# Patient Record
Sex: Male | Born: 1975
Health system: Southern US, Community
[De-identification: ages and names within clinical notes are randomized; demographics above are authoritative.]

## PROBLEM LIST (undated history)

## (undated) DIAGNOSIS — M109 Gout, unspecified: Secondary | ICD-10-CM

---

## 2004-04-20 ENCOUNTER — Emergency Department (HOSPITAL_COMMUNITY): Admission: EM | Admit: 2004-04-20 | Discharge: 2004-04-20 | Payer: Self-pay | Admitting: Emergency Medicine

## 2008-04-22 ENCOUNTER — Emergency Department: Payer: Self-pay | Admitting: Emergency Medicine

## 2008-12-14 ENCOUNTER — Emergency Department: Payer: Self-pay | Admitting: Emergency Medicine

## 2009-07-03 IMAGING — CR RIGHT ANKLE - COMPLETE 3+ VIEW
1 series · 5 of 5 positions shown · non-contrast
Comparison: none

REASON FOR EXAM: twisted his (R) ankle tonight on a curb
COMMENTS:

PROCEDURE:     DXR - DXR ANKLE RIGHT COMPLETE  - April 22, 2008  [DATE]
RESULT:     No fracture, dislocation or other acute bony abnormality is
identified.  The ankle mortise is well maintained.

[Series 1: view not recorded · 0.17mm/px · 5 of 5 slices shown]
[im 1/5]
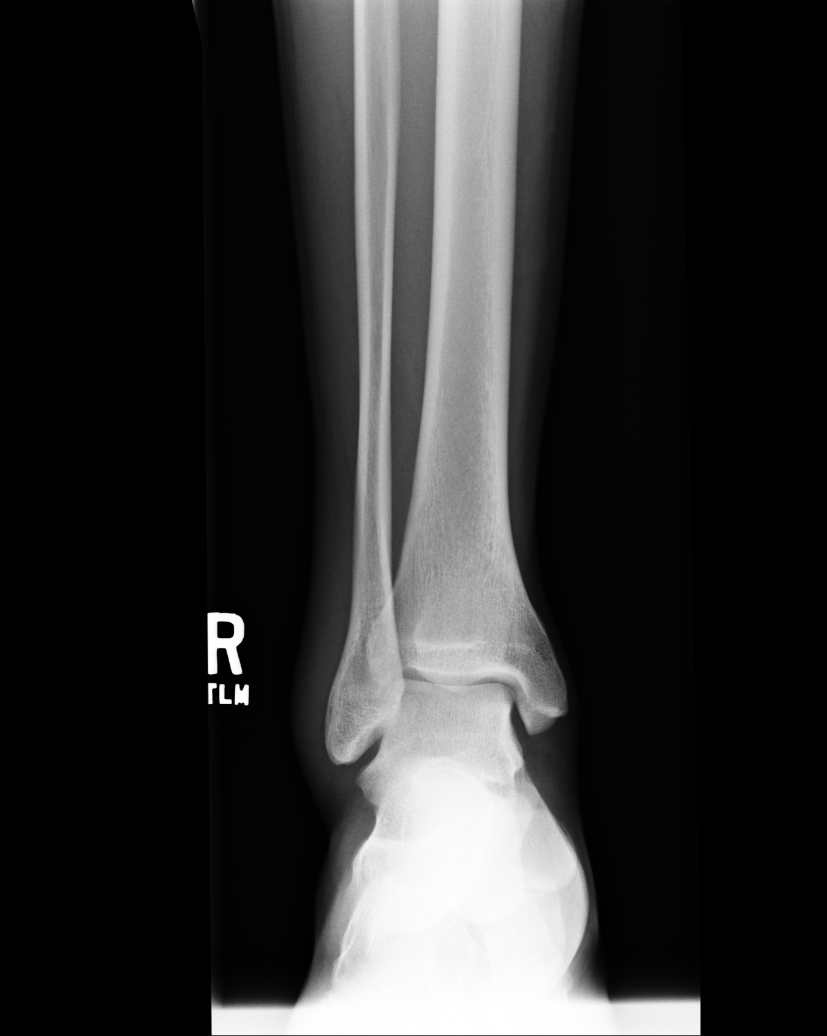
[im 2/5]
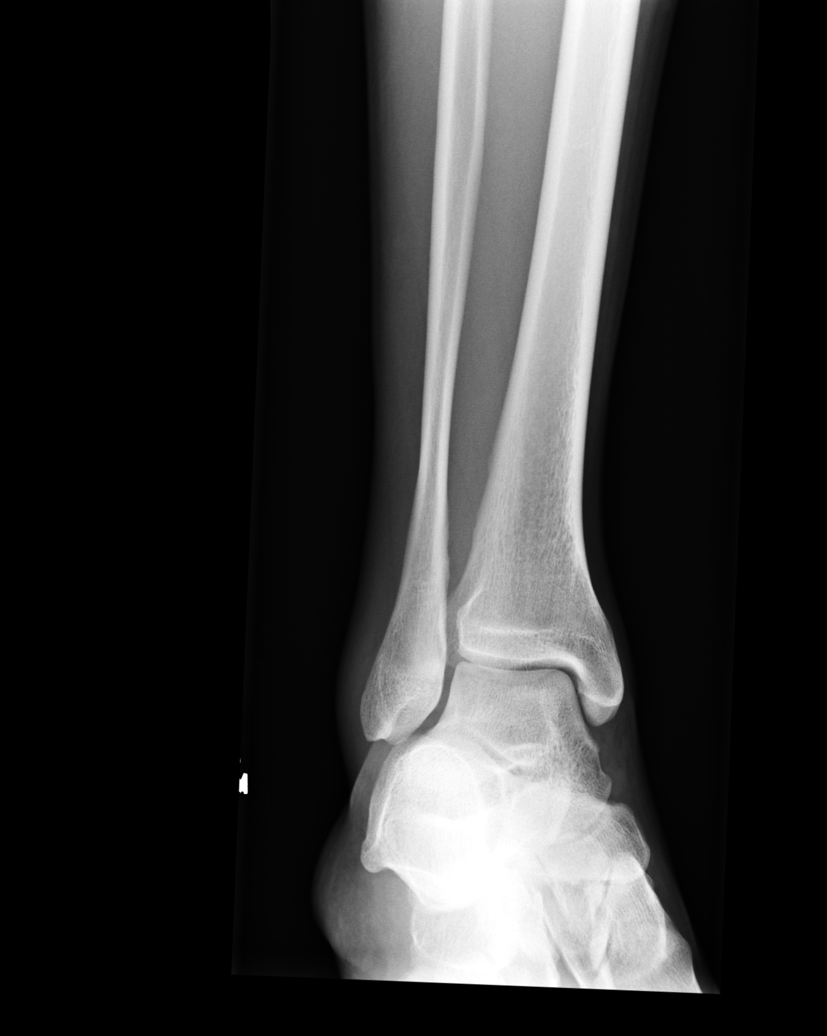
[im 3/5]
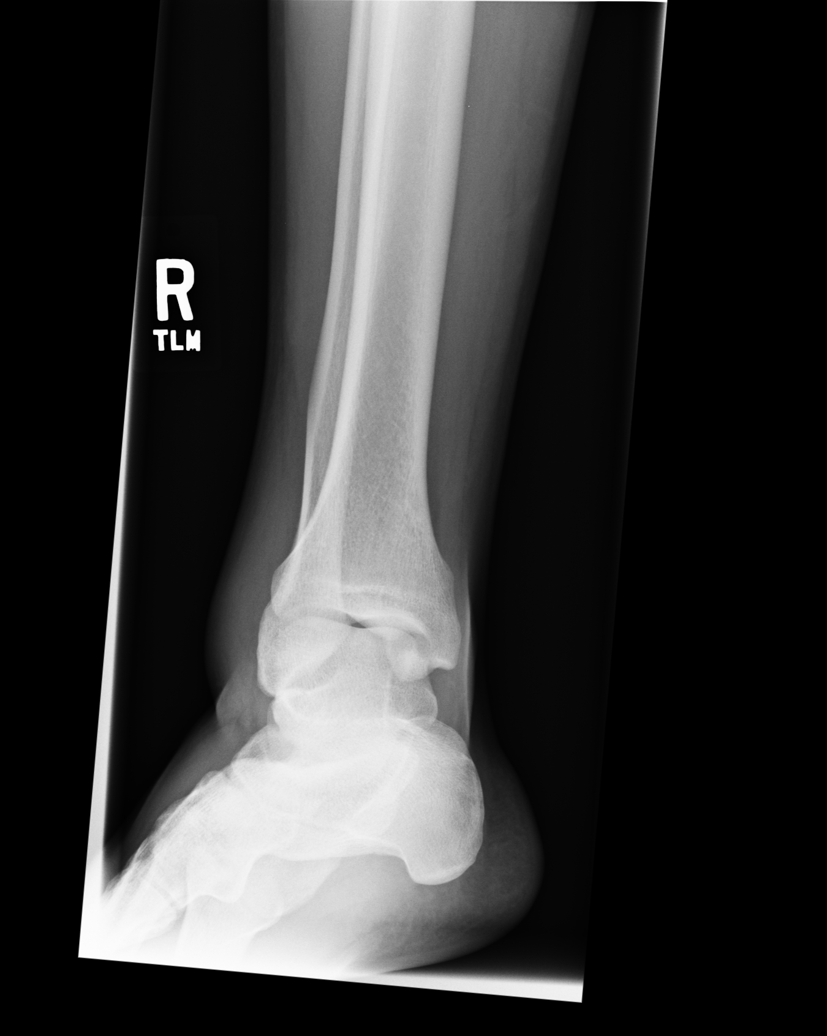
[im 4/5]
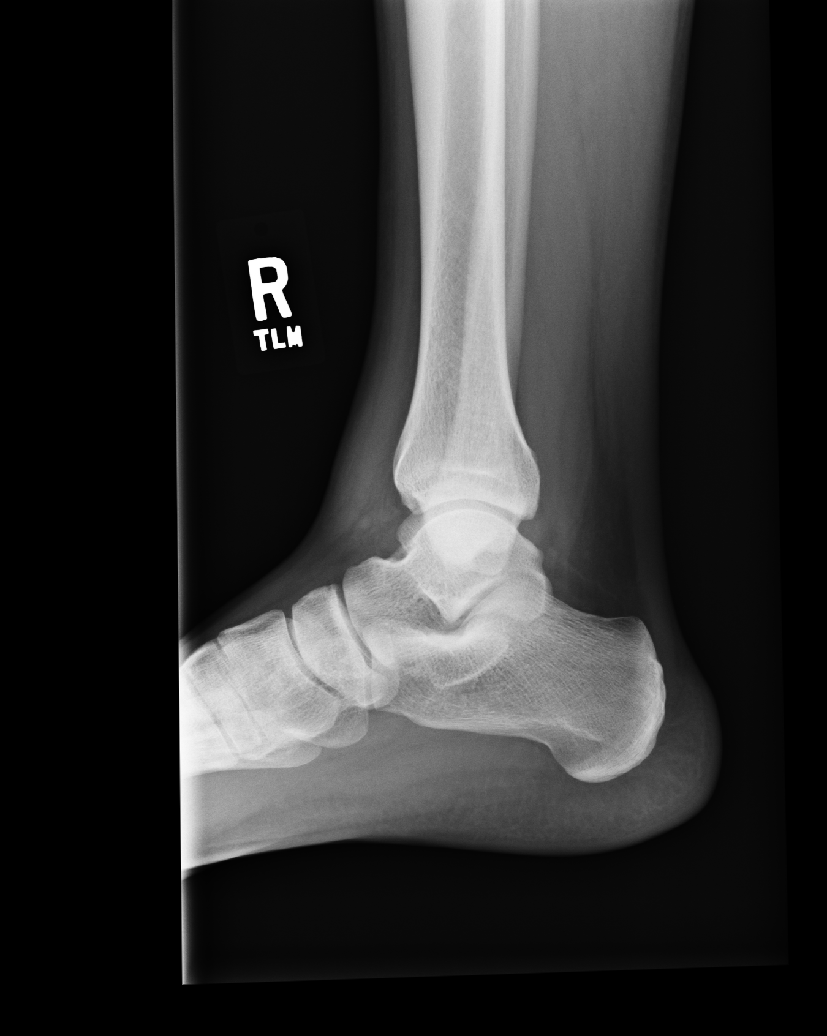
[im 5/5]
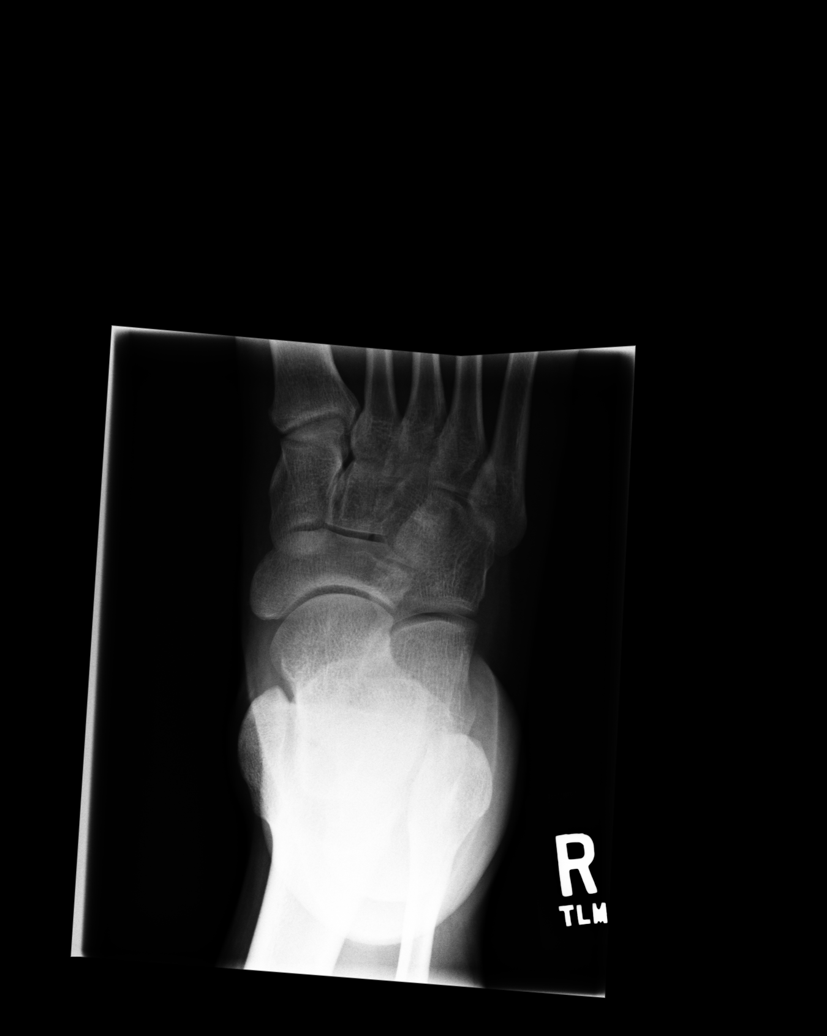

[5 of 5 positions shown; findings below may reference images not displayed]

IMPRESSION: No acute changes are identified.

## 2012-05-17 ENCOUNTER — Emergency Department: Payer: Self-pay | Admitting: Emergency Medicine

## 2017-12-18 ENCOUNTER — Emergency Department
Admission: EM | Admit: 2017-12-18 | Discharge: 2017-12-18 | Disposition: A | Discharge status: Home / Self Care | Payer: 59 | Attending: Emergency Medicine | Admitting: Emergency Medicine

## 2017-12-18 ENCOUNTER — Other Ambulatory Visit: Payer: Self-pay

## 2017-12-18 ENCOUNTER — Encounter: Payer: Self-pay | Admitting: Emergency Medicine

## 2017-12-18 DIAGNOSIS — R509 Fever, unspecified: Secondary | ICD-10-CM | POA: Diagnosis present

## 2017-12-18 DIAGNOSIS — J020 Streptococcal pharyngitis: Secondary | ICD-10-CM | POA: Insufficient documentation

## 2017-12-18 DIAGNOSIS — F17210 Nicotine dependence, cigarettes, uncomplicated: Secondary | ICD-10-CM | POA: Diagnosis not present

## 2017-12-18 HISTORY — DX: Gout, unspecified: M10.9

## 2017-12-18 LAB — INFLUENZA PANEL BY PCR (TYPE A & B)
Influenza A By PCR: NEGATIVE
Influenza B By PCR: NEGATIVE

## 2017-12-18 LAB — GROUP A STREP BY PCR: GROUP A STREP BY PCR: DETECTED — AB

## 2017-12-18 MED ORDER — AMOXICILLIN 875 MG PO TABS: 875.0000 mg | ORAL_TABLET | Freq: Two times a day (BID) | ORAL | 0 refills | Status: DC

## 2017-12-18 MED ORDER — DIPHENHYDRAMINE HCL 12.5 MG/5ML PO SYRP: 12.5000 mg | ORAL_SOLUTION | Freq: Four times a day (QID) | ORAL | 0 refills | Status: DC | PRN

## 2017-12-18 MED ORDER — KETOROLAC TROMETHAMINE 60 MG/2ML IM SOLN
60.0000 mg | Freq: Once | INTRAMUSCULAR | Status: AC
  Administered 2017-12-18: 60 mg via INTRAMUSCULAR
  Filled 2017-12-18: qty 2

## 2017-12-18 MED ORDER — NAPROXEN 500 MG PO TABS: 500.0000 mg | ORAL_TABLET | Freq: Two times a day (BID) | ORAL | Status: DC

## 2017-12-18 MED ORDER — DIPHENHYDRAMINE HCL 12.5 MG/5ML PO ELIX
25.0000 mg | ORAL_SOLUTION | Freq: Once | ORAL | Status: AC
  Administered 2017-12-18: 25 mg via ORAL
  Filled 2017-12-18: qty 10

## 2017-12-18 MED ORDER — LIDOCAINE VISCOUS 2 % MT SOLN
15.0000 mL | Freq: Once | OROMUCOSAL | Status: AC
  Administered 2017-12-18: 15 mL via OROMUCOSAL
  Filled 2017-12-18: qty 15

## 2017-12-18 MED ORDER — LIDOCAINE VISCOUS 2 % MT SOLN: 5.0000 mL | Freq: Four times a day (QID) | OROMUCOSAL | 0 refills | Status: DC | PRN

## 2017-12-18 NOTE — ED Notes (Signed)
See triage note  States he developed body aches,headache,and fever times 1 day  States fever was 101-102 at home  Has been taking ibu for pain and fever  Afebrile on arrival but still having body aches and sore throat  States throat pain is worse with swallowing

## 2017-12-18 NOTE — ED Triage Notes (Signed)
Pt in via POV with complaints of fever, sore throat, sore throat, body aches x one day.  Pt reports being dx with flu via virtual appointment and put on Tamiflu.  Pt concerned that this is something other than the flu.  NAD noted at this time.

## 2017-12-18 NOTE — ED Provider Notes (Signed)
[facility] Emergency Department Provider Note   ____________________________________________   First MD Initiated Contact with Patient 12/18/17 [phone]     (approximate)  I have reviewed the triage vital signs and the nursing notes.   HISTORY  Chief Complaint URI    HPI Danny Bray is a 42 y.o. male patient complained of fever, sore throat and body aches for 1 day.  Patient state he was diagnosed with the flu via visual appointment and started Tamiflu yesterday.  Patient state no improvement.  Patient described his pain is "sore throat/body aches".  Past Medical History:  Diagnosis Date  . Gout     There are no active problems to display for this patient.   History reviewed. No pertinent surgical history.  Prior to Admission medications   Medication Sig Start Date End Date Taking? Authorizing Provider  amoxicillin (AMOXIL) 875 MG tablet Take 1 tablet (875 mg total) by mouth 2 (two) times daily. 12/18/17   Joni Reining, PA-C  diphenhydrAMINE (BENYLIN) 12.5 MG/5ML syrup Take 5 mLs (12.5 mg total) by mouth 4 (four) times daily as needed for allergies. Mix with 5 mL of viscous lidocaine for swish and swallow. 12/18/17   Joni Reining, PA-C  lidocaine (XYLOCAINE) 2 % solution Use as directed 5 mLs in the mouth or throat every 6 (six) hours as needed for mouth pain. Mix with 5 mL of Benadryl elixir for swish and swallow. 12/18/17   Joni Reining, PA-C  naproxen (NAPROSYN) 500 MG tablet Take 1 tablet (500 mg total) by mouth 2 (two) times daily with a meal. 12/18/17   Joni Reining, PA-C    Allergies Patient has no known allergies.  No family history on file.  Social History Social History   Tobacco Use  . Smoking status: Current Every Day Smoker    Packs/day: 0.50    Types: Cigarettes  . Smokeless tobacco: Never Used  Substance Use Topics  . Alcohol use: Yes  . Drug use: Never    Review of Systems Constitutional: No  fever/chills.  Body aches. Eyes: No visual changes. ENT: Sore throat . cardiovascular: Denies chest pain. Respiratory: Denies shortness of breath. Gastrointestinal: No abdominal pain.  No nausea, no vomiting.  No diarrhea.  No constipation. Genitourinary: Negative for dysuria. Musculoskeletal: Negative for back pain. Skin: Negative for rash. Neurological: Negative for headaches, focal weakness or numbness.   ____________________________________________   PHYSICAL EXAM:  VITAL SIGNS: ED Triage Vitals  Enc Vitals Group     BP 12/18/17 0809 (!) 136/101     Pulse Rate 12/18/17 0809 (!) 104     Resp 12/18/17 0809 20     Temp 12/18/17 0809 98.6 F (37 C)     Temp Source 12/18/17 0809 Oral     SpO2 12/18/17 0809 99 %     Weight 12/18/17 0810 204 lb (92.5 kg)     Height 12/18/17 0810 6' (1.829 m)     Head Circumference --      Peak Flow --      Pain Score 12/18/17 0809 8     Pain Loc --      Pain Edu? --      Excl. in GC? --    Constitutional: Alert and oriented. Well appearing and in no acute distress. Nose: No congestion/rhinnorhea. Mouth/Throat: Mucous membranes are moist.  Oropharynx erythematous.  Edematous tonsils and uvula. Neck: No stridor. Hematological/Lymphatic/Immunilogical: Bilateral cervical lymphadenopathy Cardiovascular: Normal rate, regular rhythm. Grossly normal heart  sounds.  Good peripheral circulation. Respiratory: Normal respiratory effort.  No retractions. Lungs CTAB. Gastrointestinal: Soft and nontender. No distention. No abdominal bruits. No CVA tenderness. Musculoskeletal: No lower extremity tenderness nor edema.  No joint effusions. Neurologic:  Normal speech and language. No gross focal neurologic deficits are appreciated. No gait instability. Skin:  Skin is warm, dry and intact. No rash noted. Psychiatric: Mood and affect are normal. Speech and behavior are normal.  ____________________________________________   LABS (all labs ordered are  listed, but only abnormal results are displayed)  Labs Reviewed  GROUP A STREP BY PCR - Abnormal; Notable for the following components:      Result Value   Group A Strep by PCR DETECTED (*)    All other components within normal limits  INFLUENZA PANEL BY PCR (TYPE A & B)   ____________________________________________  EKG   ____________________________________________  RADIOLOGY  ED MD interpretation:    Official radiology report(s): No results found.  ____________________________________________   PROCEDURES  Procedure(s) performed: None  Procedures  Critical Care performed: No  ____________________________________________   INITIAL IMPRESSION / ASSESSMENT AND PLAN / ED COURSE  As part of my medical decision making, I reviewed the following data within the electronic MEDICAL RECORD NUMBER   Patient presents with 2 days of fever, sore throat and body aches.  Patient was started on Tamiflu for suspected influenza.  Discussed negative flu test but positive strep test today.  Patient given discharge care instruction work note.  Patient given prescription for amoxicillin, viscous lidocaine, Benadryl, and naproxen.  Patient advised to follow-up with the [facility] if condition persists.      ____________________________________________   FINAL CLINICAL IMPRESSION(S) / ED DIAGNOSES  Final diagnoses:  Strep pharyngitis     ED Discharge Orders        Ordered    lidocaine (XYLOCAINE) 2 % solution  Every 6 hours PRN     12/18/17 0927    diphenhydrAMINE (BENYLIN) 12.5 MG/5ML syrup  4 times daily PRN     12/18/17 0927    amoxicillin (AMOXIL) 875 MG tablet  2 times daily     12/18/17 0927    naproxen (NAPROSYN) 500 MG tablet  2 times daily with meals     12/18/17 [id number]       Note:  This document was prepared using Dragon voice recognition software and may include unintentional dictation errors.    Joni ReiningSmith, Ronald K, PA-C 12/18/17 [phone]    Emily FilbertWilliams,  Jonathan E, MD 12/18/17 [phone]

## 2020-06-16 ENCOUNTER — Ambulatory Visit: Payer: Self-pay

## 2021-02-04 ENCOUNTER — Other Ambulatory Visit: Payer: Self-pay

## 2021-02-04 ENCOUNTER — Emergency Department: Payer: 59

## 2021-02-04 DIAGNOSIS — F17210 Nicotine dependence, cigarettes, uncomplicated: Secondary | ICD-10-CM | POA: Diagnosis not present

## 2021-02-04 DIAGNOSIS — U071 COVID-19: Secondary | ICD-10-CM | POA: Insufficient documentation

## 2021-02-04 DIAGNOSIS — R0789 Other chest pain: Secondary | ICD-10-CM | POA: Diagnosis present

## 2021-02-04 NOTE — ED Triage Notes (Signed)
Pt states has flu like symptoms and chest tightness. Pt appears in no acute distres. States has had diarrhea, cough. Denies known fever.

## 2021-02-05 ENCOUNTER — Emergency Department
Admission: EM | Admit: 2021-02-05 | Discharge: 2021-02-05 | Disposition: A | Discharge status: Home / Self Care | Payer: 59 | Attending: Emergency Medicine | Admitting: Emergency Medicine

## 2021-02-05 DIAGNOSIS — U071 COVID-19: Secondary | ICD-10-CM

## 2021-02-05 LAB — CBC
HCT: 46.6 % (ref 39.0–52.0)
Hemoglobin: 15.3 g/dL (ref 13.0–17.0)
MCH: 30.2 pg (ref 26.0–34.0)
MCHC: 32.8 g/dL (ref 30.0–36.0)
MCV: 91.9 fL (ref 80.0–100.0)
Platelets: 182 10*3/uL (ref 150–400)
RBC: 5.07 MIL/uL (ref 4.22–5.81)
RDW: 13.1 % (ref 11.5–15.5)
WBC: 2.7 10*3/uL — ABNORMAL LOW (ref 4.0–10.5)
nRBC: 0 % (ref 0.0–0.2)

## 2021-02-05 LAB — BASIC METABOLIC PANEL
Anion gap: 10 (ref 5–15)
BUN: 12 mg/dL (ref 6–20)
CO2: 25 mmol/L (ref 22–32)
Calcium: 8.8 mg/dL — ABNORMAL LOW (ref 8.9–10.3)
Chloride: 103 mmol/L (ref 98–111)
Creatinine, Ser: 1.27 mg/dL — ABNORMAL HIGH (ref 0.61–1.24)
GFR, Estimated: >60 mL/min (ref >60)
Glucose, Bld: 149 mg/dL — ABNORMAL HIGH (ref 70–99)
Potassium: 3 mmol/L — ABNORMAL LOW (ref 3.5–5.1)
Sodium: 138 mmol/L (ref 135–145)

## 2021-02-05 LAB — TROPONIN I (HIGH SENSITIVITY)
Troponin I (High Sensitivity): 3 ng/L (ref <18)
Troponin I (High Sensitivity): 3 ng/L (ref <18)

## 2021-02-05 LAB — RAPID HIV SCREEN (HIV 1/2 AB+AG)
HIV 1/2 Antibodies: NONREACTIVE
HIV-1 P24 Antigen - HIV24: NONREACTIVE

## 2021-02-05 LAB — RESP PANEL BY RT-PCR (FLU A&B, COVID) ARPGX2
Influenza A by PCR: NEGATIVE
Influenza B by PCR: NEGATIVE
SARS Coronavirus 2 by RT PCR: POSITIVE — AB

## 2021-02-05 MED ORDER — ALBUTEROL SULFATE HFA 108 (90 BASE) MCG/ACT IN AERS
2.0000 | INHALATION_SPRAY | Freq: Once | RESPIRATORY_TRACT | Status: AC
  Administered 2021-02-05: 2 via RESPIRATORY_TRACT
  Filled 2021-02-05: qty 6.7

## 2021-02-05 MED ORDER — POTASSIUM CHLORIDE CRYS ER 20 MEQ PO TBCR
40.0000 meq | EXTENDED_RELEASE_TABLET | Freq: Once | ORAL | Status: AC
  Administered 2021-02-05: 40 meq via ORAL
  Filled 2021-02-05: qty 2

## 2021-02-05 NOTE — ED Notes (Signed)
Assumed care of pt upon being roomed, reports "flu like symptoms and chills" x3 days. States chest tightness, central location with inspiration and cough. Ambulated to room with steady gait, aox4. Breathing regular and unlabored

## 2021-02-05 NOTE — Discharge Instructions (Signed)
To help boost your immune system against COVID-19, please take:  - Vitamin D3 4,000 IU/day - Vitamin C 500-1,000?mg twice a day - Quercetin 250?mg twice a day - Zinc 100?mg/day - Melatonin 10?mg before bedtime (causes drowsiness) - Aspirin 325?mg/day (unless contraindicated) - Pulse Oximeter Monitoring of oxygen saturation is recommended - check your oxygen 3 times a day if less than 90% return to the ER  These medications are all over-the-counter and do not need a prescription.  QUARANTINE INSTRUCTION  Follow these instructions at home:  Protecting others To avoid spreading the illness to other people: Quarantine in your home until you have had no cough and fever for 7 days. Household members should also be quarantine for at least 14 days after being exposed to you. Wash your hands often with soap and water. If soap and water are not available, use an alcohol-based hand sanitizer. If you have not cleaned your hands, do not touch your face. Make sure that all people in your household wash their hands well and often. Cover your nose and mouth when you cough or sneeze. Throw away used tissues. Stay home if you have any cold-like or flu-like symptoms. General instructions Go to your local pharmacy and buy a pulse oximeter (this is a machine that measures your oxygen). Check your oxygen levels at least 3 times a day. If your oxygen level is 90% or less return to the emergency room immediately Take over-the-counter and prescription medicines only as told by your health care provider. If you need medication for fever take tylenol or ibuprofen Drink enough fluid to keep your urine pale yellow. Rest at home as directed by your health care provider. Do not give aspirin to a child with the flu, because of the association with Reye's syndrome. Do not use tobacco products, including cigarettes, chewing tobacco, and e-cigarettes. If you need help quitting, ask your health care provider. Keep all  follow-up visits as told by your health care provider. This is important. How is this prevented? Avoid areas where an outbreak has been reported. Avoid large groups of people. Keep a safe distance from people who are coughing and sneezing. Do not touch your face if you have not cleaned your hands. When you are around people who are sick or might be sick, wear a mask to protect yourself. Contact a health care provider if: You have symptoms of SARS (cough, fever, chest pain, shortness of breath) that are not getting better at home. You have a fever. If you have difficulty breathing go to your local ER or call 911   

## 2021-02-05 NOTE — ED Provider Notes (Signed)
[facility] Emergency Department Provider Note  ____________________________________________  Time seen: Approximately 2:33 AM  I have reviewed the triage vital signs and the nursing notes.   HISTORY  Chief Complaint Chest Pain   HPI Danny Bray is a 45 y.o. male with a history of gout who presents for evaluation of chest pain.  Patient reports that he started feeling flulike symptoms 3 days ago with chills, body aches, congestion, cough.  Has had chest tightness associated with it.  The cough is productive.  No hemoptysis.  No personal or family history of blood clots, no recent travel immobilization, no leg pain or swelling, no exogenous hormones.  The tightness in his chest is worse with inspiration and when he coughs.  He denies shortness of breath.  He reports being tested for COVID but not for flu.   Is a smoker but denies history of COPD or asthma.  Past Medical History:  Diagnosis Date  . Gout      Prior to Admission medications   Medication Sig Start Date End Date Taking? Authorizing Provider  amoxicillin (AMOXIL) 875 MG tablet Take 1 tablet (875 mg total) by mouth 2 (two) times daily. 12/18/17   Joni Reining, PA-C  diphenhydrAMINE (BENYLIN) 12.5 MG/5ML syrup Take 5 mLs (12.5 mg total) by mouth 4 (four) times daily as needed for allergies. Mix with 5 mL of viscous lidocaine for swish and swallow. 12/18/17   Joni Reining, PA-C  lidocaine (XYLOCAINE) 2 % solution Use as directed 5 mLs in the mouth or throat every 6 (six) hours as needed for mouth pain. Mix with 5 mL of Benadryl elixir for swish and swallow. 12/18/17   Joni Reining, PA-C  naproxen (NAPROSYN) 500 MG tablet Take 1 tablet (500 mg total) by mouth 2 (two) times daily with a meal. 12/18/17   Joni Reining, PA-C    Allergies Patient has no known allergies.  No family history on file.  Social History Social History   Tobacco Use  . Smoking status: Current Every Day  Smoker    Packs/day: 0.50    Types: Cigarettes  . Smokeless tobacco: Never Used  Vaping Use  . Vaping Use: Never used  Substance Use Topics  . Alcohol use: Yes  . Drug use: Never    Review of Systems  Constitutional: Negative for fever. + chills and body aches Eyes: Negative for visual changes. ENT: Negative for sore throat. + congestion Neck: No neck pain  Cardiovascular: Negative for chest pain. + chest tightness Respiratory: Negative for shortness of breath, + cough Gastrointestinal: Negative for abdominal pain, vomiting or diarrhea. Genitourinary: Negative for dysuria. Musculoskeletal: Negative for back pain. Skin: Negative for rash. Neurological: Negative for headaches, weakness or numbness. Psych: No SI or HI  ____________________________________________   PHYSICAL EXAM:  VITAL SIGNS: Vitals:   02/05/21 0300 02/05/21 0430  BP: (!) 133/96 108/84  Pulse: 88 80  Resp: 17 18  Temp:    SpO2: 96% 98%    Constitutional: Alert and oriented. Well appearing and in no apparent distress. HEENT:      Head: Normocephalic and atraumatic.         Eyes: Conjunctivae are normal. Sclera is non-icteric.       Mouth/Throat: Mucous membranes are moist.       Neck: Supple with no signs of meningismus. Cardiovascular: Regular rate and rhythm. No murmurs, gallops, or rubs. 2+ symmetrical distal pulses are present in all extremities. No JVD. Respiratory:  Normal respiratory effort. Lungs are clear to auscultation bilaterally.  Gastrointestinal: Soft, non tender. Musculoskeletal:  No edema, cyanosis, or erythema of extremities. Neurologic: Normal speech and language. Face is symmetric. Moving all extremities. No gross focal neurologic deficits are appreciated. Skin: Skin is warm, dry and intact. No rash noted. Psychiatric: Mood and affect are normal. Speech and behavior are normal.  ____________________________________________   LABS (all labs ordered are listed, but only abnormal  results are displayed)  Labs Reviewed  RESP PANEL BY RT-PCR (FLU A&B, COVID) ARPGX2 - Abnormal; Notable for the following components:      Result Value   SARS Coronavirus 2 by RT PCR POSITIVE (*)    All other components within normal limits  BASIC METABOLIC PANEL - Abnormal; Notable for the following components:   Potassium 3.0 (*)    Glucose, Bld 149 (*)    Creatinine, Ser 1.27 (*)    Calcium 8.8 (*)    All other components within normal limits  CBC - Abnormal; Notable for the following components:   WBC 2.7 (*)    All other components within normal limits  RAPID HIV SCREEN (HIV 1/2 AB+AG)  TROPONIN I (HIGH SENSITIVITY)  TROPONIN I (HIGH SENSITIVITY)   ____________________________________________  EKG  ED ECG REPORT I, Nita Sickle, the attending physician, personally viewed and interpreted this ECG.  Sinus tachycardia with a rate of 101, normal intervals, right axis deviation, no ST elevations or depressions. ____________________________________________  RADIOLOGY  I have personally reviewed the images performed during this visit and I agree with the Radiologist's read.   Interpretation by Radiologist:  DG Chest 2 View  Result Date: 02/04/2021 CLINICAL DATA:  Chest tightness.  Cough. EXAM: CHEST - 2 VIEW COMPARISON:  None. FINDINGS: The cardiomediastinal contours are normal. Minimal linear atelectasis or scarring in the right middle lobe. Pulmonary vasculature is normal. No consolidation, pleural effusion, or pneumothorax. No acute osseous abnormalities are seen. IMPRESSION: Minimal linear atelectasis or scarring in the right middle lobe. No acute findings. Electronically Signed   By: Narda Rutherford M.D.   On: 02/04/2021 23:51     ____________________________________________   PROCEDURES  Procedure(s) performed:yes .1-3 Lead EKG Interpretation Performed by: Nita Sickle, MD Authorized by: Nita Sickle, MD     Interpretation: non-specific      ECG rate assessment: tachycardic     Rhythm: sinus tachycardia     Ectopy: none     Conduction: normal     Critical Care performed:  None ____________________________________________   INITIAL IMPRESSION / ASSESSMENT AND PLAN / ED COURSE  45 y.o. male with a history of gout who presents for evaluation of chest tightness, cough, congestion, body aches and chills for 3 days.  Vaccinated for COVID.  No respiratory distress, normal work of breathing normal sats with clear lungs.  EKG showing sinus tachycardia with no prior for comparison.  Labs showing leukopenia with white count of 2.7 which can be seen on patients with COVID.  Stable creatinine at 1.27.  No history of diabetes with a sugar of 149.  Discussed the creatinine and the sugar with the patient and recommended follow-up with PCP for fasting blood glucose and further evaluation of his kidneys.  Mild hypokalemia for which he received supplementing p.o.  Ddx covid, flu, viral syndrome, HIV, pneumonia, pericarditis, myocarditis.  Even though patient is tachycardic at this time based on the other symptoms I have low suspicion for PE.  His tachycardia resolved without any intervention. HS trop x 2 negative.  Chest  x-ray visualized by me with no pneumonia, confirmed by radiology.  Will test patient for HIV, COVID and flu.    Will treat his chest tightness with an albuterol inhaler.     _________________________ 5:45 AM on 02/05/2021 -----------------------------------------  COVID positive.  Discussed quarantine, immune system boosting at home, oxygen monitoring, and follow-up with PCP.  Discussed my standard return precautions.  _____________________________________________ Please note:  Patient was evaluated in Emergency Department today for the symptoms described in the history of present illness. Patient was evaluated in the context of the global COVID-19 pandemic, which necessitated consideration that the patient might be at risk  for infection with the SARS-CoV-2 virus that causes COVID-19. Institutional protocols and algorithms that pertain to the evaluation of patients at risk for COVID-19 are in a state of rapid change based on information released by regulatory bodies including the CDC and federal and state organizations. These policies and algorithms were followed during the patient's care in the ED.  Some ED evaluations and interventions may be delayed as a result of limited staffing during the pandemic.   [place] Controlled Substance Database was reviewed by me. ____________________________________________   FINAL CLINICAL IMPRESSION(S) / ED DIAGNOSES   Final diagnoses:  COVID-19      NEW MEDICATIONS STARTED DURING THIS VISIT:  ED Discharge Orders    None       Note:  This document was prepared using Dragon voice recognition software and may include unintentional dictation errors.    Nita Sickle, MD 02/05/21 [phone]

## 2021-02-06 ENCOUNTER — Telehealth: Payer: Self-pay | Admitting: Nurse Practitioner

## 2021-02-06 NOTE — Telephone Encounter (Signed)
Called to discuss with patient about COVID-19 symptoms and the use of one of the available treatments for those with mild to moderate Covid symptoms and at a high risk of hospitalization.  Pt appears to qualify for outpatient treatment due to co-morbid conditions and/or a member of an at-risk group in accordance with the FDA Emergency Use Authorization.    Symptom onset: 02/03/2021 Vaccinated: None on file Booster? None on file Immunocompromised? No  Qualifiers: smoker, AA, BMI NIH Criteria: 3  Unable to reach pt - Voicemail left. Patient has recent labs with GFR> 60. Would be a good candidate for Paxlovid if agreeable.   Willette Alma, NP COVID Treatment Team [phone]

## 2021-10-27 ENCOUNTER — Emergency Department: Payer: 59

## 2021-10-27 ENCOUNTER — Other Ambulatory Visit: Payer: Self-pay

## 2021-10-27 ENCOUNTER — Emergency Department
Admission: EM | Admit: 2021-10-27 | Discharge: 2021-10-27 | Disposition: A | Discharge status: Home / Self Care | Payer: 59 | Attending: Emergency Medicine | Admitting: Emergency Medicine

## 2021-10-27 DIAGNOSIS — R0781 Pleurodynia: Secondary | ICD-10-CM | POA: Insufficient documentation

## 2021-10-27 DIAGNOSIS — S39011A Strain of muscle, fascia and tendon of abdomen, initial encounter: Secondary | ICD-10-CM | POA: Insufficient documentation

## 2021-10-27 DIAGNOSIS — X500XXA Overexertion from strenuous movement or load, initial encounter: Secondary | ICD-10-CM | POA: Diagnosis not present

## 2021-10-27 DIAGNOSIS — Y9389 Activity, other specified: Secondary | ICD-10-CM | POA: Insufficient documentation

## 2021-10-27 DIAGNOSIS — S3991XA Unspecified injury of abdomen, initial encounter: Secondary | ICD-10-CM | POA: Diagnosis present

## 2021-10-27 MED ORDER — CYCLOBENZAPRINE HCL 5 MG PO TABS: 5.0000 mg | ORAL_TABLET | Freq: Three times a day (TID) | ORAL | 0 refills | Status: AC | PRN

## 2021-10-27 MED ORDER — LIDOCAINE 5 % EX PTCH: 1.0000 | MEDICATED_PATCH | Freq: Two times a day (BID) | CUTANEOUS | 0 refills | Status: AC | PRN

## 2021-10-27 NOTE — ED Provider Notes (Signed)
[facility] Provider Note  Patient Contact: 10:08 AM (approximate)   History   painful rib   HPI  Danny Bray is a 46 y.o. male presents to the ED for evaluation of an acute musculoskeletal pain to his left lateral rib area.  Patient reports he was working out doing some weightlifting activity at home, when he felt immediate pop to his left lateral chest wall.  He reports pain to palpation and pain with movement.  Denies any frank shortness of breath, nausea, vomiting, or cough.     Physical Exam   Triage Vital Signs: ED Triage Vitals  Enc Vitals Group     BP 10/27/21 0901 (!) 162/119     Pulse Rate 10/27/21 0901 77     Resp 10/27/21 0901 17     Temp 10/27/21 0901 98.6 F (37 C)     Temp Source 10/27/21 0901 Oral     SpO2 10/27/21 0901 96 %     Weight 10/27/21 0900 216 lb 14.9 oz (98.4 kg)     Height 10/27/21 0900 6' (1.829 m)     Head Circumference --      Peak Flow --      Pain Score --      Pain Loc --      Pain Edu? --      Excl. in GC? --     Most recent vital signs: Vitals:   10/27/21 0901  BP: (!) 162/119  Pulse: 77  Resp: 17  Temp: 98.6 F (37 C)  SpO2: 96%     General: Alert and in no acute distress. Neck: No stridor. No cervical spine tenderness to palpation. Cardiovascular:  Good peripheral perfusion Respiratory: Normal respiratory effort without tachypnea or retractions. Lungs CTAB. Good air entry to the bases with no decreased or absent breath sounds.  No bruise, ecchymosis, or tenderness to palpation over the left lateral ribs appreciated. Gastrointestinal: Bowel sounds 4 quadrants. Soft and nontender to palpation. No guarding or rigidity. No palpable masses. No distention. No CVA tenderness. Musculoskeletal: Full range of motion to all extremities.  Neurologic:  No gross focal neurologic deficits are appreciated.  Skin:   No rash noted Other:   ED Results / Procedures / Treatments   Labs (all labs  ordered are listed, but only abnormal results are displayed) Labs Reviewed - No data to display   EKG   RADIOLOGY  I personally viewed and evaluated these images as part of my medical decision making, as well as reviewing the written report by the radiologist.  ED Provider Interpretation: no acute fracture, or lung consolidation  No results found.  PROCEDURES:  Critical Care performed: No  Procedures   MEDICATIONS ORDERED IN ED: Medications - No data to display   IMPRESSION / MDM / ASSESSMENT AND PLAN / ED COURSE  I reviewed the triage vital signs and the nursing notes.                              Differential diagnosis includes, but is not limited to, chest wall strain, abdominal oblique muscle strain, rib fracture  Patient ED evaluation of pain to the left lateral chest wall and abdominal wall after mechanical injury.  He presents to the ED and is evaluated with exam and x-rays.  No radiologic evidence of any acute fracture or intrathoracic process were reviewed by me.  Patient's clinical picture is consistent with a oblique  muscle strain as his pain is well below the inferior rib border.  Otherwise he has a normal abdominal exam.  Does not patient's diagnosis is consistent with abdominal wall muscle strain. Patient will be discharged home with prescriptions for muscle relaxant and Lidoderm patches. Patient is to follow up with his primary provider as needed or otherwise directed. Patient is given ED precautions to return to the ED for any worsening or new symptoms.    FINAL CLINICAL IMPRESSION(S) / ED DIAGNOSES   Final diagnoses:  Abdominal wall strain, initial encounter     Rx / DC Orders   ED Discharge Orders          Ordered    lidocaine (LIDODERM) 5 %  Every 12 hours PRN        10/27/21 1015    cyclobenzaprine (FLEXERIL) 5 MG tablet  3 times daily PRN        10/27/21 1015             Note:  This document was prepared using Dragon voice recognition  software and may include unintentional dictation errors.    Lissa Hoard, PA-C 10/29/21 2302    Minna Antis, MD 10/31/21 [phone]

## 2021-10-27 NOTE — Discharge Instructions (Addendum)
Your exam and XR are normal and do not show any signs of serious injury. Use the prescription meds as directed.

## 2021-10-27 NOTE — ED Notes (Signed)
31 yom c/o left sided abdominal pain since 11pm last night. The pt advised he was working out at his home gym lifting dumbells when he heard a pop. The pt has a hx of gout and denies any allergies.

## 2021-10-27 NOTE — ED Triage Notes (Signed)
Pt states he was working out lifting weights last night and felt something pop in his left lateral rib area, pt c/o pain with movement

## 2022-05-18 ENCOUNTER — Encounter: Payer: Self-pay | Admitting: Emergency Medicine

## 2022-05-18 ENCOUNTER — Emergency Department
Admission: EM | Admit: 2022-05-18 | Discharge: 2022-05-18 | Disposition: A | Discharge status: Home / Self Care | Payer: 59 | Attending: Emergency Medicine | Admitting: Emergency Medicine

## 2022-05-18 ENCOUNTER — Emergency Department: Payer: 59

## 2022-05-18 DIAGNOSIS — M109 Gout, unspecified: Secondary | ICD-10-CM | POA: Diagnosis present

## 2022-05-18 LAB — CBC
HCT: 45.3 % (ref 39.0–52.0)
Hemoglobin: 15.1 g/dL (ref 13.0–17.0)
MCH: 30.3 pg (ref 26.0–34.0)
MCHC: 33.3 g/dL (ref 30.0–36.0)
MCV: 90.8 fL (ref 80.0–100.0)
Platelets: 309 10*3/uL (ref 150–400)
RBC: 4.99 MIL/uL (ref 4.22–5.81)
RDW: 12.9 % (ref 11.5–15.5)
WBC: 8.8 10*3/uL (ref 4.0–10.5)
nRBC: 0 % (ref 0.0–0.2)

## 2022-05-18 LAB — BASIC METABOLIC PANEL
Anion gap: 8 (ref 5–15)
BUN: 13 mg/dL (ref 6–20)
CO2: 26 mmol/L (ref 22–32)
Calcium: 9.1 mg/dL (ref 8.9–10.3)
Chloride: 105 mmol/L (ref 98–111)
Creatinine, Ser: 1.04 mg/dL (ref 0.61–1.24)
GFR, Estimated: >60 mL/min (ref >60)
Glucose, Bld: 91 mg/dL (ref 70–99)
Potassium: 3.9 mmol/L (ref 3.5–5.1)
Sodium: 139 mmol/L (ref 135–145)

## 2022-05-18 LAB — URIC ACID: Uric Acid, Serum: 5.5 mg/dL (ref 3.7–8.6)

## 2022-05-18 MED ORDER — PREDNISONE 10 MG PO TABS: ORAL_TABLET | ORAL | 0 refills | Status: AC

## 2022-05-18 MED ORDER — COLCHICINE 0.6 MG PO TABS
1.2000 mg | ORAL_TABLET | Freq: Once | ORAL | Status: AC
  Administered 2022-05-18: 1.2 mg via ORAL
  Filled 2022-05-18: qty 2

## 2022-05-18 MED ORDER — COLCHICINE 0.6 MG PO TABS: 0.6000 mg | ORAL_TABLET | Freq: Once | ORAL | 0 refills | Status: AC

## 2022-05-18 NOTE — Discharge Instructions (Addendum)
Take the colchicine as soon as you can take it this morning wait 24 hours and see if this improves  pain and if not start the steroid taper.  Return to the ER if you develop fever, redness or any other concerns

## 2022-05-18 NOTE — ED Triage Notes (Signed)
Pt c/o Rt ankle swelling and pain with no injury x1 week. Pt has hx/o gout and is concerned he is having a flare up. Pt has been self medicating at home with haloperidol, naproxen and prednisone x6 days with no relief.

## 2022-05-18 NOTE — ED Provider Notes (Signed)
[facility] Provider Note    Event Date/Time   First MD Initiated Contact with Patient 05/18/22 0448     (approximate)   History   No chief complaint on file.   HPI  Danny Bray is a 46 y.o. male with a history of gout who comes in with concerns for gout flareup.  On review of patient's office visit from 01/11/2022 he was previously on allopurino. I reviewed another note from 03/12/2020 where he was seen in reports that typically prednisone helps for his gout of his right ankle.  Patient reports he has had this for the past week.  He denies any injury.  He does report some swelling and pain to the right ankle.  He reports he has had previous gout in this ankle and it feels similar is just not getting better.  He reports taking prednisone for 6 days but it is a very low dose.  He thinks he was only taking 20 mg.  He does not report any relief with this.  He is still ambulatory but does use some crutches to help assist.  He denies any swelling in his leg or any calf tenderness.   Physical Exam   Triage Vital Signs: ED Triage Vitals [05/18/22 0108]  Enc Vitals Group     BP (!) 155/93     Pulse Rate 67     Resp 16     Temp 98 F (36.7 C)     Temp Source Oral     SpO2 95 %     Weight 220 lb (99.8 kg)     Height 6' (1.829 m)     Head Circumference      Peak Flow      Pain Score      Pain Loc      Pain Edu?      Excl. in GC?     Most recent vital signs: Vitals:   05/18/22 0108  BP: (!) 155/93  Pulse: 67  Resp: 16  Temp: 98 F (36.7 C)  SpO2: 95%     General: Awake, no distress.  CV:  Good peripheral perfusion.  Resp:  Normal effort.  Abd:  No distention.  Other:  No swelling in the calf.  No calf tenderness.  He is got swelling noted in the ankle with little bit of warmth noted to it but no redness.  Range of motion intact but somewhat painful due to the gout.  He is got good distal pulse.   ED Results / Procedures / Treatments    Labs (all labs ordered are listed, but only abnormal results are displayed) Labs Reviewed  URIC ACID  CBC  BASIC METABOLIC PANEL       RADIOLOGY I have reviewed the xray personally and interpreted and no evidence of large effusion   PROCEDURES:  Critical Care performed: No  Procedures   MEDICATIONS ORDERED IN ED: Medications - No data to display   IMPRESSION / MDM / ASSESSMENT AND PLAN / ED COURSE  I reviewed the triage vital signs and the nursing notes.   Patient's presentation is most consistent with acute presentation with potential threat to life or bodily function.   Differential is gout versus fracture versus septic joint.  I have very low suspicion for septic joint given prior history of gout in this ankle patient is afebrile and patient has normal white count.  BMP normal Cr Uric Acid normal CBC normal white count  Given the  above I suspect this is most likely gout.  We discussed different options including trialing the colchicine versus doing a correct steroid dosing taper given he is on a very low-dose of the prednisone.  Patient is already on naproxen.  He declines opioids.  We will try the colchicine for 24 hours if not improving patient written a 10-day prescription for steroids.  Patient does not want to wait here over the next hour to get the repeat colchicine would rather get it filled at the pharmacy this morning.  He expressed understanding felt comfortable this plan.      FINAL CLINICAL IMPRESSION(S) / ED DIAGNOSES   Final diagnoses:  Acute gout of right ankle, unspecified cause     Rx / DC Orders   ED Discharge Orders     None        Note:  This document was prepared using Dragon voice recognition software and may include unintentional dictation errors.   Concha Se, MD 05/18/22 [phone]

## 2022-06-01 ENCOUNTER — Ambulatory Visit: Payer: 59 | Admitting: Nurse Practitioner

## 2022-07-11 ENCOUNTER — Ambulatory Visit: Payer: Self-pay | Admitting: Nurse Practitioner
